# Patient Record
Sex: Female | Born: 1970 | Race: White | Hispanic: No | Marital: Married | Smoking: Former smoker
Health system: Southern US, Community
[De-identification: ages and names within clinical notes are randomized; demographics above are authoritative.]

## PROBLEM LIST (undated history)

## (undated) DIAGNOSIS — Z8679 Personal history of other diseases of the circulatory system: Secondary | ICD-10-CM

## (undated) DIAGNOSIS — F32A Depression, unspecified: Secondary | ICD-10-CM

## (undated) DIAGNOSIS — D6804 Acquired von Willebrand disease: Secondary | ICD-10-CM

## (undated) DIAGNOSIS — I619 Nontraumatic intracerebral hemorrhage, unspecified: Secondary | ICD-10-CM

## (undated) DIAGNOSIS — E785 Hyperlipidemia, unspecified: Secondary | ICD-10-CM

## (undated) DIAGNOSIS — F1090 Alcohol use, unspecified, uncomplicated: Secondary | ICD-10-CM

## (undated) DIAGNOSIS — G4733 Obstructive sleep apnea (adult) (pediatric): Secondary | ICD-10-CM

## (undated) DIAGNOSIS — K219 Gastro-esophageal reflux disease without esophagitis: Secondary | ICD-10-CM

## (undated) DIAGNOSIS — G5600 Carpal tunnel syndrome, unspecified upper limb: Secondary | ICD-10-CM

## (undated) DIAGNOSIS — R079 Chest pain, unspecified: Secondary | ICD-10-CM

## (undated) DIAGNOSIS — G609 Hereditary and idiopathic neuropathy, unspecified: Secondary | ICD-10-CM

## (undated) HISTORY — DX: Alcohol use, unspecified, uncomplicated: F10.90

## (undated) HISTORY — DX: Acquired von Willebrand disease: D68.04

## (undated) HISTORY — DX: Personal history of other diseases of the circulatory system: Z86.79

## (undated) HISTORY — DX: Hereditary and idiopathic neuropathy, unspecified: G60.9

## (undated) HISTORY — DX: Carpal tunnel syndrome, unspecified upper limb: G56.00

## (undated) HISTORY — DX: Hyperlipidemia, unspecified: E78.5

## (undated) HISTORY — DX: Depression, unspecified: F32.A

## (undated) HISTORY — DX: Chest pain, unspecified: R07.9

## (undated) HISTORY — PX: BRAIN SURGERY: SHX531

## (undated) HISTORY — DX: Obstructive sleep apnea (adult) (pediatric): G47.33

---

## 1998-01-13 ENCOUNTER — Other Ambulatory Visit: Admission: RE | Admit: 1998-01-13 | Discharge: 1998-01-13 | Payer: Self-pay | Admitting: Obstetrics and Gynecology

## 1999-03-04 ENCOUNTER — Other Ambulatory Visit: Admission: RE | Admit: 1999-03-04 | Discharge: 1999-03-04 | Payer: Self-pay | Admitting: Obstetrics and Gynecology

## 2000-03-15 ENCOUNTER — Other Ambulatory Visit: Admission: RE | Admit: 2000-03-15 | Discharge: 2000-03-15 | Payer: Self-pay | Admitting: Obstetrics and Gynecology

## 2000-06-06 ENCOUNTER — Encounter: Payer: Self-pay | Admitting: Internal Medicine

## 2000-06-06 ENCOUNTER — Encounter: Admission: RE | Admit: 2000-06-06 | Discharge: 2000-06-06 | Payer: Self-pay | Admitting: Internal Medicine

## 2001-03-27 ENCOUNTER — Other Ambulatory Visit: Admission: RE | Admit: 2001-03-27 | Discharge: 2001-03-27 | Payer: Self-pay | Admitting: Obstetrics and Gynecology

## 2012-05-15 DIAGNOSIS — M545 Low back pain, unspecified: Secondary | ICD-10-CM | POA: Insufficient documentation

## 2012-05-21 DIAGNOSIS — M533 Sacrococcygeal disorders, not elsewhere classified: Secondary | ICD-10-CM | POA: Insufficient documentation

## 2012-12-13 ENCOUNTER — Other Ambulatory Visit: Payer: Self-pay | Admitting: Neurosurgery

## 2012-12-13 DIAGNOSIS — M549 Dorsalgia, unspecified: Secondary | ICD-10-CM

## 2012-12-20 ENCOUNTER — Ambulatory Visit
Admission: RE | Admit: 2012-12-20 | Discharge: 2012-12-20 | Disposition: A | Discharge status: Home / Self Care | Payer: BC Managed Care – PPO | Source: Ambulatory Visit | Attending: Neurosurgery | Admitting: Neurosurgery

## 2012-12-20 DIAGNOSIS — M549 Dorsalgia, unspecified: Secondary | ICD-10-CM

## 2014-12-01 DIAGNOSIS — R200 Anesthesia of skin: Secondary | ICD-10-CM | POA: Insufficient documentation

## 2014-12-01 DIAGNOSIS — M5416 Radiculopathy, lumbar region: Secondary | ICD-10-CM | POA: Insufficient documentation

## 2014-12-01 DIAGNOSIS — M51369 Other intervertebral disc degeneration, lumbar region without mention of lumbar back pain or lower extremity pain: Secondary | ICD-10-CM | POA: Insufficient documentation

## 2014-12-15 DIAGNOSIS — M4726 Other spondylosis with radiculopathy, lumbar region: Secondary | ICD-10-CM | POA: Insufficient documentation

## 2015-09-23 ENCOUNTER — Emergency Department: Payer: BLUE CROSS/BLUE SHIELD

## 2015-09-23 ENCOUNTER — Emergency Department
Admission: EM | Admit: 2015-09-23 | Discharge: 2015-09-23 | Disposition: A | Discharge status: Home / Self Care | Payer: BLUE CROSS/BLUE SHIELD | Attending: Emergency Medicine | Admitting: Emergency Medicine

## 2015-09-23 ENCOUNTER — Encounter: Payer: Self-pay | Admitting: Emergency Medicine

## 2015-09-23 DIAGNOSIS — S91011A Laceration without foreign body, right ankle, initial encounter: Secondary | ICD-10-CM | POA: Insufficient documentation

## 2015-09-23 DIAGNOSIS — Y9389 Activity, other specified: Secondary | ICD-10-CM | POA: Insufficient documentation

## 2015-09-23 DIAGNOSIS — W1841XA Slipping, tripping and stumbling without falling due to stepping on object, initial encounter: Secondary | ICD-10-CM | POA: Diagnosis not present

## 2015-09-23 DIAGNOSIS — Y998 Other external cause status: Secondary | ICD-10-CM | POA: Insufficient documentation

## 2015-09-23 DIAGNOSIS — Y9289 Other specified places as the place of occurrence of the external cause: Secondary | ICD-10-CM | POA: Diagnosis not present

## 2015-09-23 DIAGNOSIS — Z23 Encounter for immunization: Secondary | ICD-10-CM | POA: Insufficient documentation

## 2015-09-23 HISTORY — DX: Nontraumatic intracerebral hemorrhage, unspecified: I61.9

## 2015-09-23 HISTORY — DX: Gastro-esophageal reflux disease without esophagitis: K21.9

## 2015-09-23 MED ORDER — LIDOCAINE HCL (PF) 1 % IJ SOLN
INTRAMUSCULAR | Status: AC
  Filled 2015-09-23: qty 5

## 2015-09-23 MED ORDER — BACITRACIN ZINC 500 UNIT/GM EX OINT
TOPICAL_OINTMENT | Freq: Two times a day (BID) | CUTANEOUS | Status: DC
  Administered 2015-09-23: 07:00:00 via TOPICAL

## 2015-09-23 MED ORDER — LIDOCAINE-EPINEPHRINE-TETRACAINE (LET) SOLUTION
3.0000 mL | Freq: Once | NASAL | Status: AC
  Administered 2015-09-23: 3 mL via TOPICAL
  Filled 2015-09-23: qty 3

## 2015-09-23 MED ORDER — TETANUS-DIPHTH-ACELL PERTUSSIS 5-2.5-18.5 LF-MCG/0.5 IM SUSP
0.5000 mL | Freq: Once | INTRAMUSCULAR | Status: AC
  Administered 2015-09-23: 0.5 mL via INTRAMUSCULAR
  Filled 2015-09-23: qty 0.5

## 2015-09-23 MED ORDER — BACITRACIN ZINC 500 UNIT/GM EX OINT: TOPICAL_OINTMENT | CUTANEOUS | Status: AC

## 2015-09-23 MED ORDER — BACITRACIN ZINC 500 UNIT/GM EX OINT
TOPICAL_OINTMENT | CUTANEOUS | Status: AC
  Filled 2015-09-23: qty 0.9

## 2015-09-23 NOTE — ED Notes (Addendum)
Pt states she stepped on a window. Laceration to R heel. Pt is unsure of tetanus status, but states she had brain surgery a year ago, so she believes all her shots are up to date.
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
## 2015-09-23 NOTE — ED Notes (Signed)
Pt stepped on window has lac to posterior right foot.

## 2015-09-23 NOTE — Discharge Instructions (Signed)
Laceration Care, Adult °A laceration is a cut that goes through all of the layers of the skin and into the tissue that is right under the skin. Some lacerations heal on their own. Others need to be closed with stitches (sutures), staples, skin adhesive strips, or skin glue. Proper laceration care minimizes the risk of infection and helps the laceration to heal better. °HOW TO CARE FOR YOUR LACERATION °If sutures or staples were used: °· Keep the wound clean and dry. °· If you were given a bandage (dressing), you should change it at least one time per day or as told by your health care provider. You should also change it if it becomes wet or dirty. °· Keep the wound completely dry for the first 24 hours or as told by your health care provider. After that time, you may shower or bathe. However, make sure that the wound is not soaked in water until after the sutures or staples have been removed. °· Clean the wound one time each day or as told by your health care provider: °¨ Wash the wound with soap and water. °¨ Rinse the wound with water to remove all soap. °¨ Pat the wound dry with a clean towel. Do not rub the wound. °· After cleaning the wound, apply a thin layer of antibiotic ointment as told by your health care provider. This will help to prevent infection and keep the dressing from sticking to the wound. °· Have the sutures or staples removed as told by your health care provider. °If skin adhesive strips were used: °· Keep the wound clean and dry. °· If you were given a bandage (dressing), you should change it at least one time per day or as told by your health care provider. You should also change it if it becomes dirty or wet. °· Do not get the skin adhesive strips wet. You may shower or bathe, but be careful to keep the wound dry. °· If the wound gets wet, pat it dry with a clean towel. Do not rub the wound. °· Skin adhesive strips fall off on their own. You may trim the strips as the wound heals. Do not  remove skin adhesive strips that are still stuck to the wound. They will fall off in time. °If skin glue was used: °· Try to keep the wound dry, but you may briefly wet it in the shower or bath. Do not soak the wound in water, such as by swimming. °· After you have showered or bathed, gently pat the wound dry with a clean towel. Do not rub the wound. °· Do not do any activities that will make you sweat heavily until the skin glue has fallen off on its own. °· Do not apply liquid, cream, or ointment medicine to the wound while the skin glue is in place. Using those may loosen the film before the wound has healed. °· If you were given a bandage (dressing), you should change it at least one time per day or as told by your health care provider. You should also change it if it becomes dirty or wet. °· If a dressing is placed over the wound, be careful not to apply tape directly over the skin glue. Doing that may cause the glue to be pulled off before the wound has healed. °· Do not pick at the glue. The skin glue usually remains in place for 5-10 days, then it falls off of the skin. °General Instructions °· Take over-the-counter and prescription   medicines only as told by your health care provider. °· If you were prescribed an antibiotic medicine or ointment, take or apply it as told by your doctor. Do not stop using it even if your condition improves. °· To help prevent scarring, make sure to cover your wound with sunscreen whenever you are outside after stitches are removed, after adhesive strips are removed, or when glue remains in place and the wound is healed. Make sure to wear a sunscreen of at least 30 SPF. °· Do not scratch or pick at the wound. °· Keep all follow-up visits as told by your health care provider. This is important. °· Check your wound every day for signs of infection. Watch for: °· Redness, swelling, or pain. °· Fluid, blood, or pus. °· Raise (elevate) the injured area above the level of your heart  while you are sitting or lying down, if possible. °SEEK MEDICAL CARE IF: °· You received a tetanus shot and you have swelling, severe pain, redness, or bleeding at the injection site. °· You have a fever. °· A wound that was closed breaks open. °· You notice a bad smell coming from your wound or your dressing. °· You notice something coming out of the wound, such as wood or glass. °· Your pain is not controlled with medicine. °· You have increased redness, swelling, or pain at the site of your wound. °· You have fluid, blood, or pus coming from your wound. °· You notice a change in the color of your skin near your wound. °· You need to change the dressing frequently due to fluid, blood, or pus draining from the wound. °· You develop a new rash. °· You develop numbness around the wound. °SEEK IMMEDIATE MEDICAL CARE IF: °· You develop severe swelling around the wound. °· Your pain suddenly increases and is severe. °· You develop painful lumps near the wound or on skin that is anywhere on your body. °· You have a red streak going away from your wound. °· The wound is on your hand or foot and you cannot properly move a finger or toe. °· The wound is on your hand or foot and you notice that your fingers or toes look pale or bluish. °  °This information is not intended to replace advice given to you by your health care provider. Make sure you discuss any questions you have with your health care provider. °  °Document Released: 08/08/2005 Document Revised: 12/23/2014 Document Reviewed: 08/04/2014 °Elsevier Interactive Patient Education ©2016 Else[employer] Inc[employer] ° °Stitches, Staples, or Adhesive Wound Closure °Health care providers use stitches (sutures), staples, and certain glue (skin adhesives) to hold skin together while it heals (wound closure). You may need this treatment after you have surgery or if you cut your skin accidentally. These methods help your skin to heal more quickly and make it less likely that you will have  a scar. A wound may take several months to heal completely. °The type of wound you have determines when your wound gets closed. In most cases, the wound is closed as soon as possible (primary skin closure). Sometimes, closure is delayed so the wound can be cleaned and allowed to heal naturally. This reduces the chance of infection. Delayed closure may be needed if your wound: °· Is caused by a bite. °· Happened more than 6 hours ago. °· Involves loss of skin or the tissues under the skin. °· Has dirt or debris in it that cannot be removed. °· Is infected. °WHAT   ARE THE DIFFERENT KINDS OF WOUND CLOSURES? °There are many options for wound closure. The one that your health care provider uses depends on how deep and how large your wound is. °Adhesive Glue °To use this type of glue to close a wound, your health care provider holds the edges of the wound together and paints the glue on the surface of your skin. You may need more than one layer of glue. Then the wound may be covered with a light bandage (dressing). °This type of skin closure may be used for small wounds that are not deep (superficial). Using glue for wound closure is less painful than other methods. It does not require a medicine that numbs the area (local anesthetic). This method also leaves nothing to be removed. Adhesive glue is often used for children and on facial wounds. °Adhesive glue cannot be used for wounds that are deep, uneven, or bleeding. It is not used inside of a wound.  °Adhesive Strips °These strips are made of sticky (adhesive), porous paper. They are applied across your skin edges like a regular adhesive bandage. You leave them on until they fall off. °Adhesive strips may be used to close very superficial wounds. They may also be used along with sutures to improve the closure of your skin edges.  °Sutures °Sutures are the oldest method of wound closure. Sutures can be made from natural substances, such as silk, or from synthetic  materials, such as nylon and steel. They can be made from a material that your body can break down as your wound heals (absorbable), or they can be made from a material that needs to be removed from your skin (nonabsorbable). They come in many different strengths and sizes. °Your health care provider attaches the sutures to a steel needle on one end. Sutures can be passed through your skin, or through the tissues beneath your skin. Then they are tied and cut. Your skin edges may be closed in one continuous stitch or in separate stitches. °Sutures are strong and can be used for all kinds of wounds. Absorbable sutures may be used to close tissues under the skin. The disadvantage of sutures is that they may cause skin reactions that lead to infection. Nonabsorbable sutures need to be removed. °Staples °When surgical staples are used to close a wound, the edges of your skin on both sides of the wound are brought close together. A staple is placed across the wound, and an instrument secures the edges together. Staples are often used to close surgical cuts (incisions). °Staples are faster to use than sutures, and they cause less skin reaction. Staples need to be removed using a tool that bends the staples away from your skin. °HOW DO I CARE FOR MY WOUND CLOSURE? °· Take medicines only as directed by your health care provider. °· If you were prescribed an antibiotic medicine for your wound, finish it all even if you start to feel better. °· Use ointments or creams only as directed by your health care provider. °· Wash your hands with soap and water before and after touching your wound. °· Do not soak your wound in water. Do not take baths, swim, or use a hot tub until your health care provider approves. °· Ask your health care provider when you can start showering. Cover your wound if directed by your health care provider. °· Do not take out your own sutures or staples. °· Do not pick at your wound. Picking can cause an  infection. °·   Keep all follow-up visits as directed by your health care provider. This is important. HOW LONG WILL I HAVE MY WOUND CLOSURE?  Leave adhesive glue on your skin until the glue peels away.  Leave adhesive strips on your skin until the strips fall off.  Absorbable sutures will dissolve within several days.  Nonabsorbable sutures and staples must be removed. The location of the wound will determine how long they stay in. This can range from several days to a couple of weeks. WHEN SHOULD I SEEK HELP FOR MY WOUND CLOSURE? Contact your health care provider if:  You have a fever.  You have chills.  You have drainage, redness, swelling, or pain at your wound.  There is a bad smell coming from your wound.  The skin edges of your wound start to separate after your sutures have been removed.  Your wound becomes thick, raised, and darker in color after your sutures come out (scarring).   This information is not intended to replace advice given to you by your health care provider. Make sure you discuss any questions you have with your health care provider.   Document Released: 05/03/2001 Document Revised: 08/29/2014 Document Reviewed: 01/15/2014 Elsevier Interactive Patient Education 2016 [employer] Inc.  Sutured Wound Care Sutures are stitches that can be used to close wounds. Taking care of your wound properly can help to prevent pain and infection. It can also help your wound to heal more quickly. HOW TO CARE FOR YOUR SUTURED WOUND Wound Care  Keep the wound clean and dry.  If you were given a bandage (dressing), you should change it at least once per day or as directed by your health care provider. You should also change it if it becomes wet or dirty.  Keep the wound completely dry for the first 24 hours or as directed by your health care provider. After that time, you may shower or bathe. However, make sure that the wound is not soaked in water until the sutures have been  removed.  Clean the wound one time each day or as directed by your health care provider.  Wash the wound with soap and water.  Rinse the wound with water to remove all soap.  Pat the wound dry with a clean towel. Do not rub the wound.  Aftercleaning the wound, apply a thin layer of antibioticointment as directed by your health care provider. This will help to prevent infection and keep the dressing from sticking to the wound.  Have the sutures removed as directed by your health care provider. General Instructions  Take or apply medicines only as directed by your health care provider.  To help prevent scarring, make sure to cover your wound with sunscreen whenever you are outside after the sutures are removed and the wound is healed. Make sure to wear a sunscreen of at least 30 SPF.  If you were prescribed an antibiotic medicine or ointment, finish all of it even if you start to feel better.  Do not scratch or pick at the wound.  Keep all follow-up visits as directed by your health care provider. This is important.  Check your wound every day for signs of infection. Watch for:   Redness, swelling, or pain.  Fluid, blood, or pus.  Raise (elevate) the injured area above the level of your heart while you are sitting or lying down, if possible.  Avoid stretching your wound.  Drink enough fluids to keep your urine clear or pale yellow. SEEK MEDICAL CARE IF:  You received a tetanus shot and you have swelling, severe pain, redness, or bleeding at the injection site.  You have a fever.  A wound that was closed breaks open.  You notice a bad smell coming from the wound.  You notice something coming out of the wound, such as wood or glass.  Your pain is not controlled with medicine.  You have increased redness, swelling, or pain at the site of your wound.  You have fluid, blood, or pus coming from your wound.  You notice a change in the color of your skin near your  wound.  You need to change the dressing frequently due to fluid, blood, or pus draining from the wound.  You develop a new rash.  You develop numbness around the wound. SEEK IMMEDIATE MEDICAL CARE IF:  You develop severe swelling around the injury site.  Your pain suddenly increases and is severe.  You develop painful lumps near the wound or on skin that is anywhere on your body.  You have a red streak going away from your wound.  The wound is on your hand or foot and you cannot properly move a finger or toe.  The wound is on your hand or foot and you notice that your fingers or toes look pale or bluish.   This information is not intended to replace advice given to you by your health care provider. Make sure you discuss any questions you have with your health care provider.   Document Released: 09/15/2004 Document Revised: 12/23/2014 Document Reviewed: 03/20/2013 Elsevier Interactive Patient Education [employer]

## 2015-11-05 DIAGNOSIS — K219 Gastro-esophageal reflux disease without esophagitis: Secondary | ICD-10-CM | POA: Insufficient documentation

## 2015-11-05 DIAGNOSIS — E7800 Pure hypercholesterolemia, unspecified: Secondary | ICD-10-CM | POA: Insufficient documentation

## 2015-11-05 DIAGNOSIS — M879 Osteonecrosis, unspecified: Secondary | ICD-10-CM | POA: Insufficient documentation

## 2015-11-05 DIAGNOSIS — K589 Irritable bowel syndrome without diarrhea: Secondary | ICD-10-CM | POA: Insufficient documentation

## 2015-11-05 DIAGNOSIS — R791 Abnormal coagulation profile: Secondary | ICD-10-CM | POA: Insufficient documentation

## 2015-11-12 DIAGNOSIS — J301 Allergic rhinitis due to pollen: Secondary | ICD-10-CM | POA: Insufficient documentation

## 2016-04-19 DIAGNOSIS — F331 Major depressive disorder, recurrent, moderate: Secondary | ICD-10-CM | POA: Insufficient documentation

## 2016-05-19 ENCOUNTER — Ambulatory Visit (HOSPITAL_COMMUNITY): Payer: BLUE CROSS/BLUE SHIELD | Admitting: Licensed Clinical Social Worker

## 2016-09-06 DIAGNOSIS — M1712 Unilateral primary osteoarthritis, left knee: Secondary | ICD-10-CM | POA: Insufficient documentation

## 2017-05-18 DIAGNOSIS — G5711 Meralgia paresthetica, right lower limb: Secondary | ICD-10-CM | POA: Insufficient documentation

## 2017-07-06 ENCOUNTER — Encounter: Payer: Self-pay | Admitting: Registered"

## 2017-07-06 ENCOUNTER — Encounter: Payer: BLUE CROSS/BLUE SHIELD | Attending: *Deleted | Admitting: Registered"

## 2017-07-06 DIAGNOSIS — Z713 Dietary counseling and surveillance: Secondary | ICD-10-CM | POA: Insufficient documentation

## 2017-07-06 DIAGNOSIS — E7800 Pure hypercholesterolemia, unspecified: Secondary | ICD-10-CM | POA: Insufficient documentation

## 2017-07-06 NOTE — Patient Instructions (Signed)
Think about a backup plan for exercise: might help to make goals that are SMART: Specific, Measurable, Attainable, Relevant and Timely. Consider having more protein with your breakfast Consider having fish 2-3 week, look for those higher in omega 3

## 2017-07-06 NOTE — Progress Notes (Addendum)
Medical Nutrition Therapy:  Appt start time: 1405 end time:  1510.  Assessment:  Primary concerns today: Pt states she was 145 lbs before 2015 brain surgery and since has gained weight. Pt states she also has had issues with period and not sure where she is on the menopausal timeframe as it had previously stopped for a year.  Pt states she has had lab work that has indicated high cholesterol. RD did not have time to address cholesterol topic today, but will revisit at next appointment. Per referral labs ttl chol: 315, HDL 54. A1c 5.0%, TSH 4.0. Vit D 26.8 ng/mL. BP 122/80  Pt states she and her husband enjoys cooking and looking forward to holiday baking. Pt states [place], 1-2x times month, and enjoy eating out and having a few beers.   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 Pt states with rainy weather she doesn't get much activity. Pt states she takes gabapentin for nerve pain in leg and also has back pain that limits some of her activity. Pt also states her activity has slowed down since her husband's ability to move around has been decreased after trouble with his hip since they spend a lot of time together.  Sleep: good.  Preferred Learning Style:   No preference indicated   Learning Readiness:   Ready  MEDICATIONS: reviewed    DIETARY INTAKE:  Avoided foods include: walnuts are the only nut she doesn't like.    24-hr recall:  B ( AM): 1-2 c coffee, shredded wheat, almond or 2% milk OR apple jacks OR mcdonalds Snk ( AM): 90 cal cereal bars for snack or lunch OR yogurt  L ( PM): [employer] tuna sandwich OR salads from [employer] (apple pecan) Snk ( PM):  D ( PM): stuffed pablano peppers OR steak, veggies,  Snk ( PM): jello Beverages: 2 alcoholic drinks, sometimes hard lemonade, diet ginger ale, water  Usual physical activity: yard work, taking care of animals  Estimated energy needs: 1800  calories 200 g carbohydrates 113 g protein 60  g fat  Progress Towards Goal(s):  In progress.   Nutritional Diagnosis:  NI-5.8.4 Inconsistent carbohydrate intake As related to carb only breakfast.  As evidenced by dietary recall.    Intervention:  Nutrition Education. Discussed macronutrients and importance of balanced meals. Discussed exercise role in health. Provided information about fatty fish and the basics of Mediterranean and DASH diet. Discussed the difference between portion control and intuitive eating (basics).  Teaching Method Utilized:  Visual Auditory  Handouts given during visit include:  My Plate Planner  Snack Sheet  Fish guide  Barriers to learning/adherence to lifestyle change: none  Demonstrated degree of understanding via:  Teach Back   Monitoring/Evaluation:  Dietary intake, exercise, and body weight in 6 week(s).

## 2017-09-05 ENCOUNTER — Ambulatory Visit: Payer: BLUE CROSS/BLUE SHIELD | Admitting: Registered"

## 2018-06-14 ENCOUNTER — Emergency Department (HOSPITAL_COMMUNITY): Payer: PRIVATE HEALTH INSURANCE

## 2018-06-14 ENCOUNTER — Emergency Department (HOSPITAL_COMMUNITY)
Admission: EM | Admit: 2018-06-14 | Discharge: 2018-06-14 | Disposition: A | Discharge status: Home / Self Care | Payer: PRIVATE HEALTH INSURANCE | Attending: Emergency Medicine | Admitting: Emergency Medicine

## 2018-06-14 ENCOUNTER — Other Ambulatory Visit: Payer: Self-pay

## 2018-06-14 ENCOUNTER — Encounter (HOSPITAL_COMMUNITY): Payer: Self-pay | Admitting: Emergency Medicine

## 2018-06-14 DIAGNOSIS — J0120 Acute ethmoidal sinusitis, unspecified: Secondary | ICD-10-CM | POA: Diagnosis not present

## 2018-06-14 DIAGNOSIS — R519 Headache, unspecified: Secondary | ICD-10-CM

## 2018-06-14 DIAGNOSIS — R51 Headache: Secondary | ICD-10-CM

## 2018-06-14 DIAGNOSIS — Z79899 Other long term (current) drug therapy: Secondary | ICD-10-CM | POA: Diagnosis not present

## 2018-06-14 DIAGNOSIS — Z8679 Personal history of other diseases of the circulatory system: Secondary | ICD-10-CM | POA: Diagnosis not present

## 2018-06-14 LAB — I-STAT TROPONIN, ED: Troponin i, poc: 0 ng/mL (ref 0.00–0.08)

## 2018-06-14 LAB — DIFFERENTIAL
ABS IMMATURE GRANULOCYTES: 0.01 10*3/uL (ref 0.00–0.07)
Basophils Absolute: 0.1 10*3/uL (ref 0.0–0.1)
Basophils Relative: 1 %
EOS ABS: 0.1 10*3/uL (ref 0.0–0.5)
Eosinophils Relative: 2 %
IMMATURE GRANULOCYTES: 0 %
LYMPHS ABS: 1 10*3/uL (ref 0.7–4.0)
LYMPHS PCT: 20 %
Monocytes Absolute: 0.4 10*3/uL (ref 0.1–1.0)
Monocytes Relative: 8 %
Neutro Abs: 3.6 10*3/uL (ref 1.7–7.7)
Neutrophils Relative %: 69 %

## 2018-06-14 LAB — CBC
HEMATOCRIT: 42.4 % (ref 36.0–46.0)
Hemoglobin: 13.4 g/dL (ref 12.0–15.0)
MCH: 29.5 pg (ref 26.0–34.0)
MCHC: 31.6 g/dL (ref 30.0–36.0)
MCV: 93.4 fL (ref 80.0–100.0)
NRBC: 0 % (ref 0.0–0.2)
Platelets: 326 10*3/uL (ref 150–400)
RBC: 4.54 MIL/uL (ref 3.87–5.11)
RDW: 13.2 % (ref 11.5–15.5)
WBC: 5.3 10*3/uL (ref 4.0–10.5)

## 2018-06-14 LAB — COMPREHENSIVE METABOLIC PANEL
ALT: 15 U/L (ref 0–44)
AST: 21 U/L (ref 15–41)
Albumin: 4.4 g/dL (ref 3.5–5.0)
Alkaline Phosphatase: 65 U/L (ref 38–126)
Anion gap: 7 (ref 5–15)
BILIRUBIN TOTAL: 1 mg/dL (ref 0.3–1.2)
BUN: 7 mg/dL (ref 6–20)
CO2: 25 mmol/L (ref 22–32)
Calcium: 9.5 mg/dL (ref 8.9–10.3)
Chloride: 107 mmol/L (ref 98–111)
Creatinine, Ser: 0.69 mg/dL (ref 0.44–1.00)
GFR calc Af Amer: >60 mL/min (ref >60)
GFR calc non Af Amer: >60 mL/min (ref >60)
GLUCOSE: 93 mg/dL (ref 70–99)
POTASSIUM: 3.8 mmol/L (ref 3.5–5.1)
Sodium: 139 mmol/L (ref 135–145)
TOTAL PROTEIN: 7.2 g/dL (ref 6.5–8.1)

## 2018-06-14 LAB — I-STAT CHEM 8, ED
BUN: 7 mg/dL (ref 6–20)
CREATININE: 0.6 mg/dL (ref 0.44–1.00)
Calcium, Ion: 1.15 mmol/L (ref 1.15–1.40)
Chloride: 106 mmol/L (ref 98–111)
Glucose, Bld: 90 mg/dL (ref 70–99)
HEMATOCRIT: 42 % (ref 36.0–46.0)
Hemoglobin: 14.3 g/dL (ref 12.0–15.0)
POTASSIUM: 3.8 mmol/L (ref 3.5–5.1)
Sodium: 138 mmol/L (ref 135–145)
TCO2: 24 mmol/L (ref 22–32)

## 2018-06-14 LAB — PROTIME-INR
INR: 1.01
Prothrombin Time: 13.2 seconds (ref 11.4–15.2)

## 2018-06-14 LAB — APTT: aPTT: 33 seconds (ref 24–36)

## 2018-06-14 MED ORDER — KETOROLAC TROMETHAMINE 30 MG/ML IJ SOLN
30.0000 mg | Freq: Once | INTRAMUSCULAR | Status: AC
  Administered 2018-06-14: 30 mg via INTRAVENOUS
  Filled 2018-06-14: qty 1

## 2018-06-14 MED ORDER — AMOXICILLIN-POT CLAVULANATE 875-125 MG PO TABS: 1.0000 | ORAL_TABLET | Freq: Two times a day (BID) | ORAL | 0 refills | Status: DC

## 2018-06-14 MED ORDER — PROCHLORPERAZINE EDISYLATE 10 MG/2ML IJ SOLN
10.0000 mg | Freq: Once | INTRAMUSCULAR | Status: AC
  Administered 2018-06-14: 10 mg via INTRAVENOUS
  Filled 2018-06-14: qty 2

## 2018-06-14 MED ORDER — TRIAMCINOLONE ACETONIDE 55 MCG/ACT NA AERO: 2.0000 | INHALATION_SPRAY | Freq: Every day | NASAL | 12 refills | Status: DC

## 2018-06-14 MED ORDER — IBUPROFEN 800 MG PO TABS: 800.0000 mg | ORAL_TABLET | Freq: Three times a day (TID) | ORAL | 0 refills | Status: AC | PRN

## 2018-06-14 MED ORDER — DIPHENHYDRAMINE HCL 50 MG/ML IJ SOLN
25.0000 mg | Freq: Once | INTRAMUSCULAR | Status: AC
  Administered 2018-06-14: 25 mg via INTRAVENOUS
  Filled 2018-06-14: qty 1

## 2018-06-14 MED ORDER — SODIUM CHLORIDE 0.9 % IV BOLUS
1000.0000 mL | Freq: Once | INTRAVENOUS | Status: AC
  Administered 2018-06-14: 1000 mL via INTRAVENOUS

## 2018-06-14 MED ORDER — DEXAMETHASONE SODIUM PHOSPHATE 10 MG/ML IJ SOLN
10.0000 mg | Freq: Once | INTRAMUSCULAR | Status: AC
  Administered 2018-06-14: 10 mg via INTRAVENOUS
  Filled 2018-06-14: qty 1

## 2018-06-14 NOTE — ED Notes (Signed)
Patient reports feels better and would like to go home now. Assured she will be discharged in a little bit since shifts and nurses changed.

## 2018-06-14 NOTE — Discharge Instructions (Signed)
Continue taking the Mucinex.  Increase her fluid intake.  Rest as much as possible.  Follow-up with your doctor.

## 2018-06-14 NOTE — ED Triage Notes (Signed)
To ed via [place] [facility] from home- with c/o headache started 1 week ago- hx of brain bleed with surgery in 2015 - went to dr today and was sent here for follow up with neuro.

## 2018-06-14 NOTE — ED Provider Notes (Signed)
[facility] EMERGENCY DEPARTMENT Provider Note   CSN: [id number] Arrival date & time: 06/14/18  1138     History   Chief Complaint Chief Complaint  Patient presents with  . Headache    HPI Kristen Vega is a 47 y.o. female.  HPI Patient presents to the emergency department with severe headache that started 1 week ago.  Patient states that she is had a brain bleed in the past.  Patient states that light seems to make it worse.  Patient states that she was sent here by her primary doctor.  States she took over-the-counter medications without any significant relief of her symptoms.  The patient denies chest pain, shortness of breath, blurred vision, neck pain, fever, cough, weakness, numbness, dizziness, anorexia, edema, abdominal pain, nausea, vomiting, diarrhea, rash, back pain, dysuria, hematemesis, bloody stool, near syncope, or syncope. Past Medical History:  Diagnosis Date  . GERD (gastroesophageal reflux disease)   . ICH (intracerebral hemorrhage) (HCC)     There are no active problems to display for this patient.   Past Surgical History:  Procedure Laterality Date  . BRAIN SURGERY       OB History   None      Home Medications    Prior to Admission medications   Medication Sig Start Date End Date Taking? Authorizing Provider  Cetirizine-Pseudoephedrine (ZYRTEC-D PO) Take 1 tablet by mouth at bedtime.    Yes [provider]  esomeprazole (NEXIUM) 40 MG capsule Take 40 mg daily at 12 noon by mouth.   Yes [provider]  estradiol (CLIMARA - DOSED IN MG/24 HR) 0.1 mg/24hr patch Place 0.1 mg once a week onto the skin.   Yes [provider]  FLUoxetine (PROZAC) 20 MG capsule Take 20 mg by mouth at bedtime.    Yes [provider]  gabapentin (NEURONTIN) 600 MG tablet Take 600 mg by mouth 2 (two) times daily.    Yes [provider]  guaiFENesin (MUCINEX) 600 MG 12 hr tablet Take 660 mg by mouth 2  (two) times daily as needed for to loosen phlegm.   Yes [provider]  progesterone (PROMETRIUM) 100 MG capsule Take 100 mg by mouth at bedtime.    Yes [provider]  traMADol (ULTRAM) 50 MG tablet Take 50 mg by mouth every 6 (six) hours as needed for moderate pain or severe pain (Back pain).    Yes [provider]  vitamin E 1000 UNIT capsule Take 1,000 Units by mouth 2 (two) times daily.   Yes [provider]    Family History No family history on file.  Social History Social History   Tobacco Use  . Smoking status: Never Smoker  Substance Use Topics  . Alcohol use: Yes  . Drug use: Not on file     Allergies   Patient has no known allergies.   Review of Systems Review of Systems All other systems negative except as documented in the HPI. All pertinent positives and negatives as reviewed in the HPI.  Physical Exam Updated Vital Signs BP (!) 128/93   Pulse 70   Temp 97.9 F (36.6 C) (Oral)   Resp 12   Ht 5\' 3"  (1.6 m)   Wt 81.2 kg   LMP 06/07/2018 Comment: spotting- on HRT  SpO2 97%   BMI 31.71 kg/m   Physical Exam  Constitutional: She is oriented to person, place, and time. She appears well-developed and well-nourished. No distress.  HENT:  Head:  Normocephalic and atraumatic.  Mouth/Throat: Oropharynx is clear and moist.  Eyes: Pupils are equal, round, and reactive to light.  Neck: Normal range of motion. Neck supple.  Cardiovascular: Normal rate, regular rhythm and normal heart sounds. Exam reveals no gallop and no friction rub.  No murmur heard. Pulmonary/Chest: Effort normal and breath sounds normal. No respiratory distress. She has no wheezes.  Abdominal: Soft. Bowel sounds are normal. She exhibits no distension. There is no tenderness.  Neurological: She is alert and oriented to person, place, and time. She has normal strength. She exhibits normal muscle tone. Coordination and gait normal. GCS eye subscore is 4. GCS  verbal subscore is 5. GCS motor subscore is 6.  Skin: Skin is warm and dry. Capillary refill takes less than 2 seconds. No rash noted. No erythema.  Psychiatric: She has a normal mood and affect. Her behavior is normal.  Nursing note and vitals reviewed.    ED Treatments / Results  Labs (all labs ordered are listed, but only abnormal results are displayed) Labs Reviewed  PROTIME-INR  APTT  CBC  DIFFERENTIAL  COMPREHENSIVE METABOLIC PANEL  I-STAT TROPONIN, ED  CBG MONITORING, ED  I-STAT CHEM 8, ED    EKG None  Radiology Ct Head Wo Contrast  Result Date: 06/14/2018 CLINICAL DATA:  Headache, progressing EXAM: CT HEAD WITHOUT CONTRAST TECHNIQUE: Contiguous axial images were obtained from the base of the skull through the vertex without intravenous contrast. COMPARISON:  None. FINDINGS: Brain: The ventricles are normal in size and configuration. There is no intracranial mass, hemorrhage, extra-axial fluid collection, or midline shift. Brain parenchyma appears unremarkable. No evident acute infarct. Vascular: No hyperdense vessel. There is no evident vascular calcification. Skull: The patient has had a previous left frontal craniotomy. Bony calvarium otherwise appears intact. Sinuses/Orbits: There is mucosal thickening in several ethmoid air cells. Other visualized paranasal sinuses are clear. Orbits appear symmetric bilaterally. Other: Mastoids on the right are clear. There is opacification of the mastoid air cells on the left. IMPRESSION: 1. Previous left-sided craniotomy. Bony structures otherwise unremarkable. 2. Brain parenchyma appears unremarkable. No mass or hemorrhage. No evident acute infarct. 3. There is mucosal thickening in several ethmoid air cells. There is extensive mastoid air cell disease on the left. Electronically Signed   By: Bretta Bang III M.D.   On: 06/14/2018 13:12    Procedures Procedures (including critical care time)  Medications Ordered in  ED Medications  sodium chloride 0.9 % bolus 1,000 mL (0 mLs Intravenous Stopped 06/14/18 1811)  ketorolac (TORADOL) 30 MG/ML injection 30 mg (30 mg Intravenous Given 06/14/18 1640)  diphenhydrAMINE (BENADRYL) injection 25 mg (25 mg Intravenous Given 06/14/18 1640)  prochlorperazine (COMPAZINE) injection 10 mg (10 mg Intravenous Given 06/14/18 1639)  dexamethasone (DECADRON) injection 10 mg (10 mg Intravenous Given 06/14/18 1639)     Initial Impression / Assessment and Plan / ED Course  I have reviewed the triage vital signs and the nursing notes.  Pertinent labs & imaging results that were available during my care of the patient were reviewed by me and considered in my medical decision making (see chart for details).     Patient has had some nasal congestion and drainage over the last few weeks.  Patient does have severe ethmoid sinusitis based on her CT scan findings.  Patient will be advised to follow-up with her primary doctor.  The patient is feeling almost complete resolution of her symptoms since getting the medications and fluids.  She is advised to return  for any worsening in her condition.  That her headache is related to the sinusitis rather than an intracranial process. Final Clinical Impressions(s) / ED Diagnoses   Final diagnoses:  None    ED Discharge Orders    None       Charlestine Night, PA-C 06/15/18 0023    Little, Ambrose Finland, MD 06/17/18 1025

## 2018-06-26 DIAGNOSIS — M5459 Other low back pain: Secondary | ICD-10-CM | POA: Insufficient documentation

## 2019-12-04 IMAGING — CT CT HEAD W/O CM
3 series · 14 of 47 positions shown, 16 images · non-contrast
Comparison: None.

CLINICAL DATA: Headache, progressing

EXAM:
CT HEAD WITHOUT CONTRAST
TECHNIQUE: Contiguous axial images were obtained from the base of the skull
through the vertex without intravenous contrast.

[Series 3: head 5.0 h30s · axial · 0.48mm/px · z∈[-127,+13]mm · 8 of 34 slices shown, 10 images]
[im 3/34  brain]
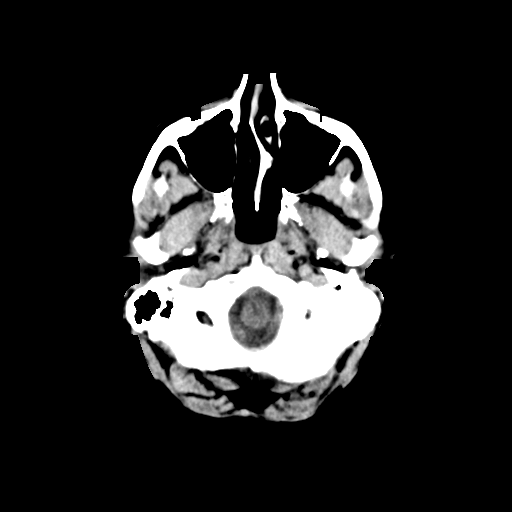
[im 3/34  bone]
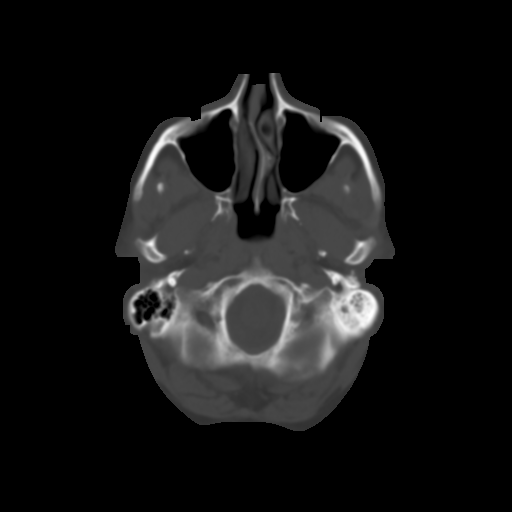
[im 7/34  brain]
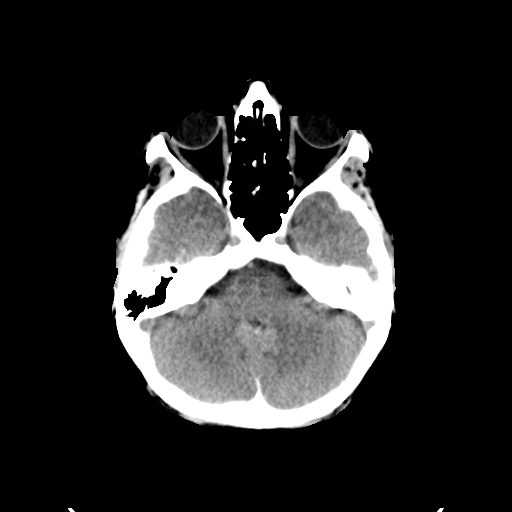
[im 11/34  brain]
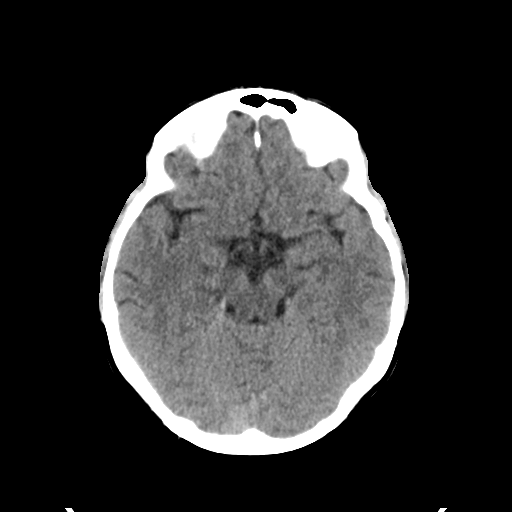
[im 15/34  brain]
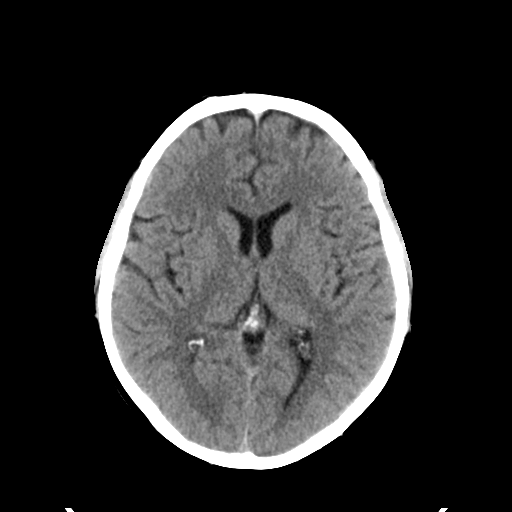
[im 19/34  brain]
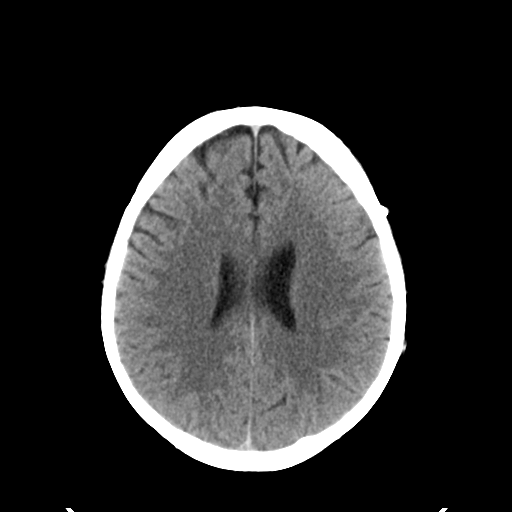
[im 19/34  bone]
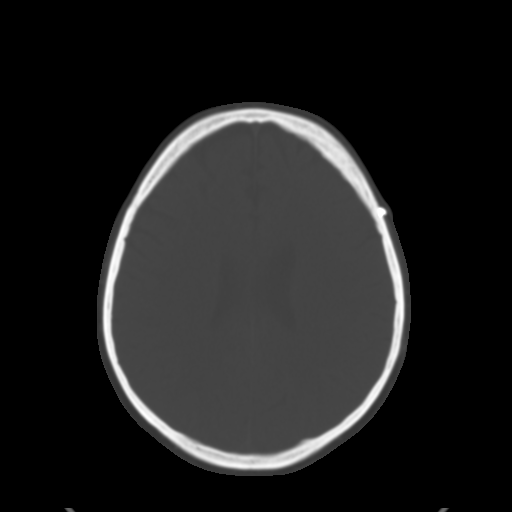
[im 23/34  brain]
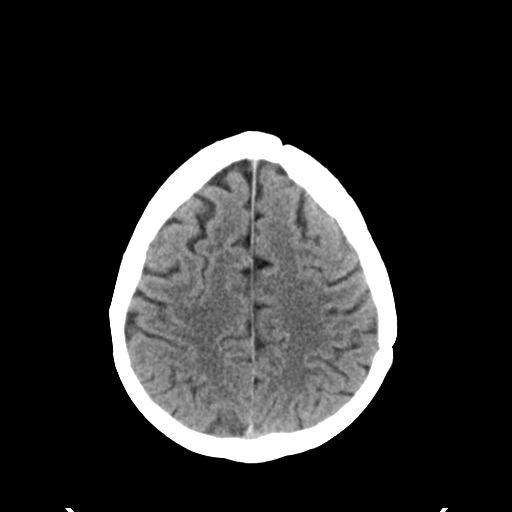
[im 27/34  brain]
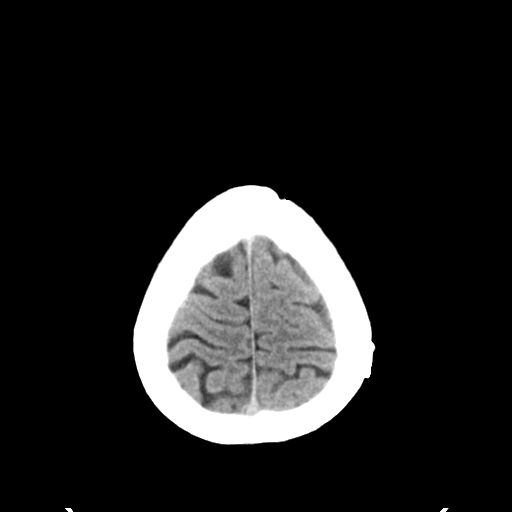
[im 31/34  brain]
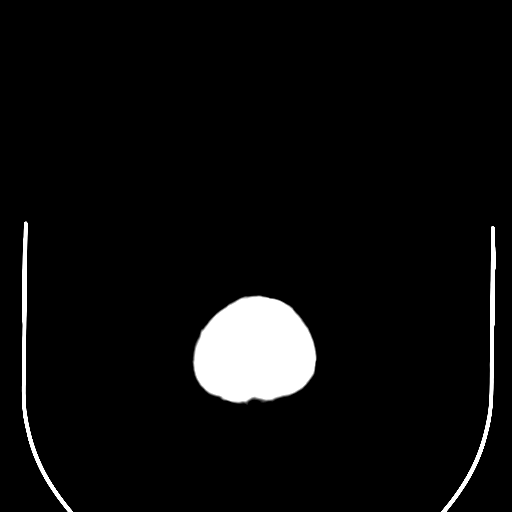

[Series 5: head 3.0 mpr sag · sagittal · 0.32mm/px · 3 of 67 slices shown]
[im 23/67  brain]
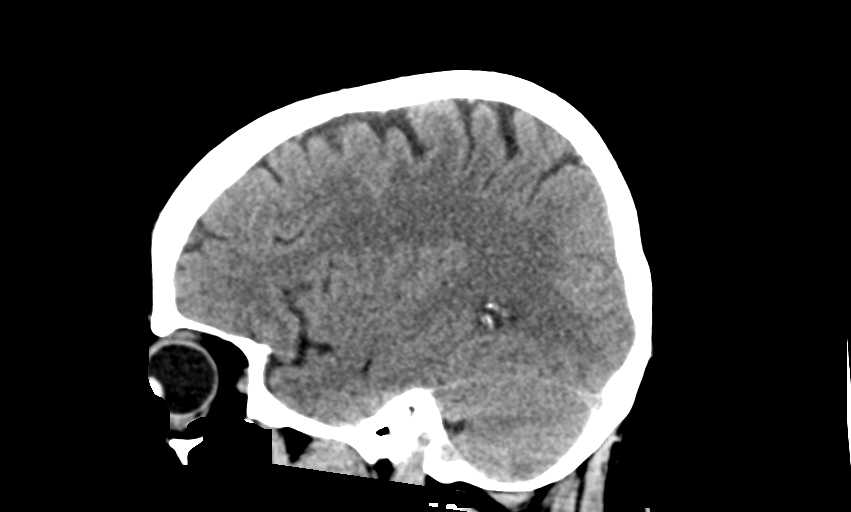
[im 34/67  brain]
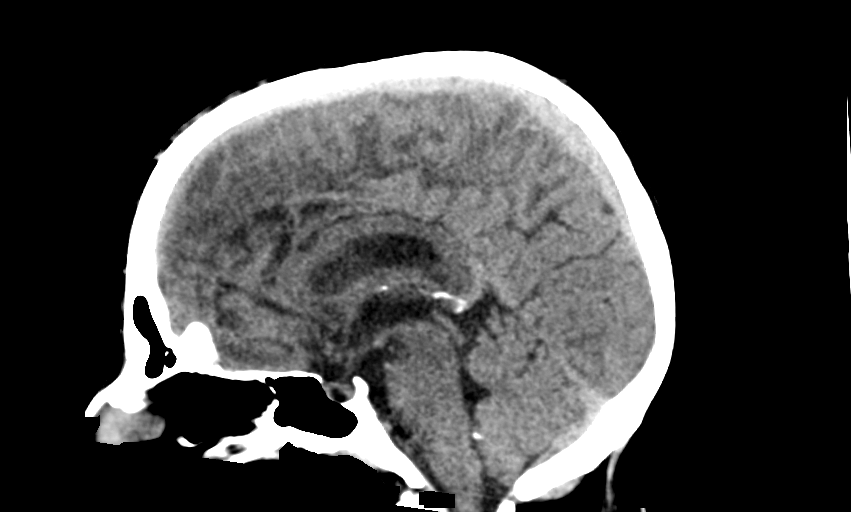
[im 45/67  brain]
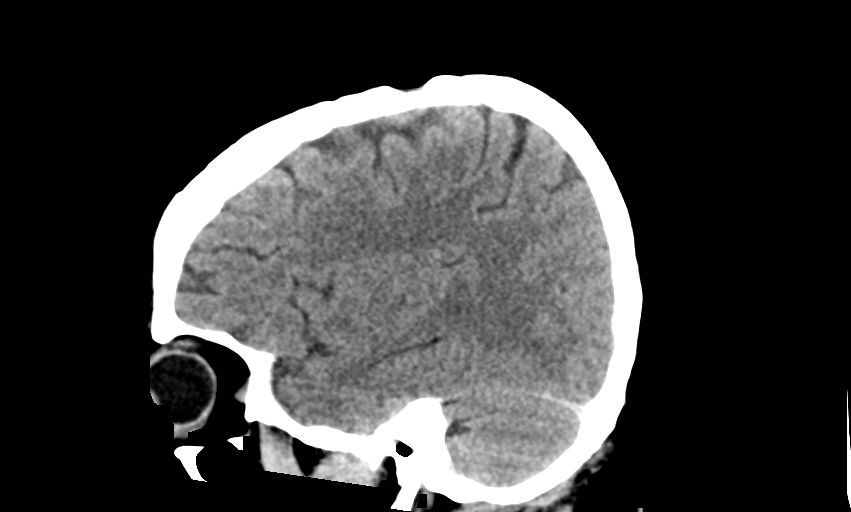

[Series 6: head 3.0 mpr cor · coronal · 0.32mm/px · 3 of 76 slices shown]
[im 26/76  brain]
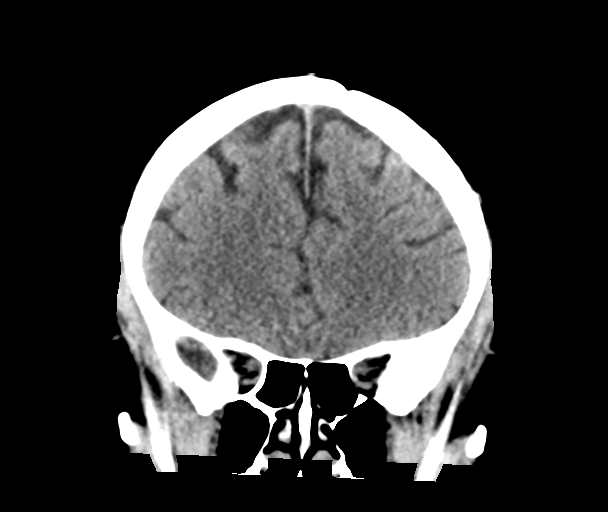
[im 34/76  brain]
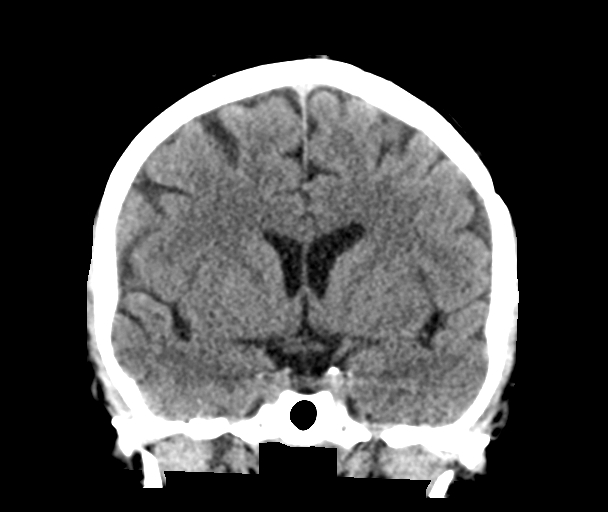
[im 42/76  brain]
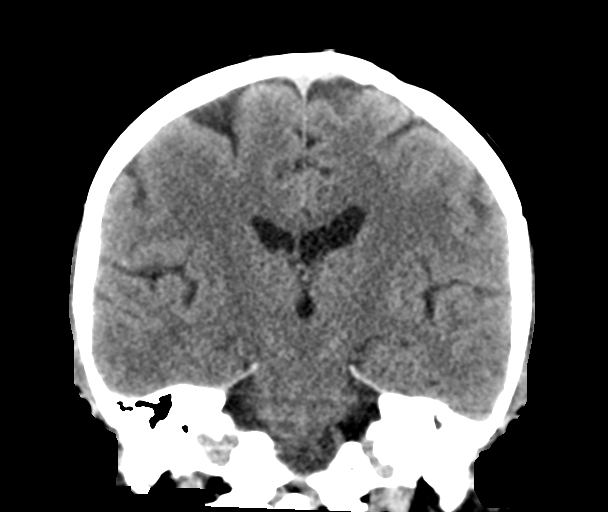

[14 of 47 positions shown; findings below may reference images not displayed]

FINDINGS: Brain: The ventricles are normal in size and configuration. There is
no intracranial mass, hemorrhage, extra-axial fluid collection, or
midline shift. Brain parenchyma appears unremarkable. No evident
acute infarct.

Vascular: No hyperdense vessel. There is no evident vascular
calcification.

Skull: The patient has had a previous left frontal craniotomy. Bony
calvarium otherwise appears intact.

Sinuses/Orbits: There is mucosal thickening in several ethmoid air
cells. Other visualized paranasal sinuses are clear. Orbits appear
symmetric bilaterally.

Other: Mastoids on the right are clear. There is opacification of
the mastoid air cells on the left.
IMPRESSION: 1. Previous left-sided craniotomy. Bony structures otherwise
unremarkable.

2. Brain parenchyma appears unremarkable. No mass or hemorrhage. No
evident acute infarct.

3. There is mucosal thickening in several ethmoid air cells. There
is extensive mastoid air cell disease on the left.

## 2021-11-16 NOTE — Progress Notes (Signed)
?Cardiology Office Note:   ? ?Date:  11/17/2021  ? ?ID:  Kristen Vega, DOB 1971/05/13, MRN [id number] ? ?PCP:  Kristen Hasten, PA-C  ? ?[facility] Providers ?Cardiologist:  Alverda Skeans, MD ?Referring MD: [name], MD  ? ?Chief Complaint/Reason for Referral:  Chest pain ? ?ASSESSMENT:   ? ?1. Chest pain, unspecified type   ?2. Precordial pain   ? ? ?PLAN:   ? ?In order of problems listed above: ? ?The patient does have a anterior septal MI pattern on her EKG.  I will obtain an echocardiogram to evaluate this further.  She does have chest pain that seems to have some atypical features.  We will obtain a coronary CTA to evaluate further.  If the patient has mild obstructive coronary artery disease, they will require a statin (with goal LDL < 70) and aspirin, if they have high-grade disease we will need to consider optimal medical therapy and if symptoms are refractory to medical therapy, then a cardiac catheterization with possible PCI will be pursued to alleviate symptoms.  If they have high risk disease we will proceed directly to cardiac catheterization.  Follow up PRN. ? ? ?     ? ?  ? ?Dispo:  No follow-ups on file.  ? ?  ? ?Medication Adjustments/Labs and Tests Ordered: ?Current medicines are reviewed at length with the patient today.  Concerns regarding medicines are outlined above. ? ?The following changes have been made:  no change  ? ?Labs/tests ordered: ?Orders Placed This Encounter  ?Procedures  ? CT CORONARY MORPH W/CTA COR W/SCORE W/CA W/CM &/OR WO/CM  ? EKG 12-Lead  ? ECHOCARDIOGRAM COMPLETE  ? ? ?Medication Changes: ?Meds ordered this encounter  ?Medications  ? metoprolol tartrate (LOPRESSOR) 100 MG tablet  ?  Sig: Take 1 tablet (100 mg total) by mouth once for 1 dose. Take 90-120 minutes prior to scan.  ?  Dispense:  1 tablet  ?  Refill:  0  ? ? ? ?Current medicines are reviewed at length with the patient today.  The patient does not have concerns regarding  medicines. ? ? ?History of Present Illness:   ? ?FOCUSED PROBLEM LIST:   ?1.  Alcohol abuse ?2.  Cerebral aneurysm status post coiling 2015 ?3.  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  Hyperlipidemia ?5.  Von Willebrand Disease (mild, potentially) ? ? ?The patient is a 51 y.o. female with the indicated medical history here for recommendations regarding chest pain.  The patient was seen by her primary care provider recently.  She had reported intermittent chest pain over the left side with associated left arm pain as well.  It was unclear whether it was exertional or occurring at rest.  An EKG was done which demonstrated sinus rhythm and a possible septal infarction. ? ?The patient tells me that she had pleurisy when she was younger and she has a tenderness to palpation of her left chest.  Additionally at times with exertion and at rest she can get a deeper tingling type chest pain.  This does not occur reliably with exertion.  It has happened more than a few times over the last few weeks.  Does not seem to be associate with shortness of breath but she does get short of breath at times.  She does exercise and is trying to lose weight.  She does not seem to have this pain reliably when she exercises but it can happen.  She denies any presyncope, syncope,  palpitations, paroxysmal nocturnal dyspnea, orthopnea. ? ?Current Medications: ?Current Meds  ?Medication Sig  ? atorvastatin (LIPITOR) 40 MG tablet Take 40 mg by mouth daily.  ? Cetirizine-Pseudoephedrine (ZYRTEC-D PO) Take 1 tablet by mouth at bedtime.   ? esomeprazole (NEXIUM) 40 MG capsule Take 40 mg daily at 12 noon by mouth.  ? estradiol (CLIMARA - DOSED IN MG/24 HR) 0.1 mg/24hr patch Place 0.1 mg once a week onto the skin.  ? FLUoxetine (PROZAC) 20 MG capsule Take 20 mg by mouth at bedtime.   ? gabapentin (NEURONTIN) 600 MG tablet Take 600 mg by mouth 2 (two) times daily.   ? ibuprofen (ADVIL,MOTRIN) 800 MG tablet Take 1 tablet (800 mg total) by mouth  every 8 (eight) hours as needed.  ? metoprolol tartrate (LOPRESSOR) 100 MG tablet Take 1 tablet (100 mg total) by mouth once for 1 dose. Take 90-120 minutes prior to scan.  ? progesterone (PROMETRIUM) 100 MG capsule Take 100 mg by mouth at bedtime.   ? traMADol (ULTRAM) 50 MG tablet Take 50 mg by mouth every 6 (six) hours as needed for moderate pain or severe pain (Back pain).   ?  ? ?Allergies:    ?Patient has no known allergies.  ? ?Social History:   ?Social History  ? ?Tobacco Use  ? Smoking status: Former  ?  Types: Cigarettes  ?Substance Use Topics  ? Alcohol use: Yes  ?  ? ?Family Hx: ?Family History  ?Problem Relation Age of Onset  ? Cancer Father   ? Hepatitis C Brother   ? Hypertension Maternal Grandmother   ? Heart disease Maternal Grandmother   ? Stroke Maternal Grandmother   ? Arthritis Maternal Grandmother   ? Arthritis Maternal Grandfather   ? Stroke Maternal Grandfather   ? Heart disease Maternal Grandfather   ? Hypertension Maternal Grandfather   ?  ? ?Review of Systems:   ?Please see the history of present illness.    ?All other systems reviewed and are negative. ?  ? ? ?EKGs/Labs/Other Test Reviewed:   ? ?EKG:  EKG is ordered today. ?The ekg ordered today demonstrates NSR ? ?Prior CV studies: ?None available ? ?Other studies Reviewed: ?Additional studies/ records that were reviewed today include: External EKG. ?Review of the above records demonstrates: Normal sinus rhythm with anteroseptal MI pattern ? ?Recent Labs: ?No results found for requested labs within last 8760 hours.  ? ?Recent Lipid Panel ?No results found for: CHOL, TRIG, HDL, LDLCALC, LDLDIRECT ? ?Risk Assessment/Calculations:   ? ? ?    ? ?Physical Exam:   ? ?VS:  BP 118/70   Pulse 69   Ht 5\' 3"  (1.6 m)   Wt 176 lb (79.8 kg)   SpO2 98%   BMI 31.18 kg/m?    ?Wt Readings from Last 3 Encounters:  ?11/17/21 176 lb (79.8 kg)  ?06/14/18 179 lb (81.2 kg)  ?09/23/15 174 lb (78.9 kg)  ?  ?GENERAL:  No apparent distress, AOx3 ?HEENT:  No  carotid bruits, +2 carotid impulses, no scleral icterus ?CAR: RRR no murmurs, gallops, rubs, or thrills ?RES:  Clear to auscultation bilaterally ?ABD:  Soft, nontender, nondistended, positive bowel sounds x 4 ?VASC:  +2 radial pulses, +2 carotid pulses, palpable pedal pulses ?NEURO:  CN 2-12 grossly intact; motor and sensory grossly intact ?PSYCH:  No active depression or anxiety ?EXT:  No edema, ecchymosis, or cyanosis ? ?Signed, ?Orbie PyoArun K Taveon Enyeart, MD  ?11/17/2021 10:57 AM    ?[facility] ?[phone]  [address] [place], [place]  [address] ?Phone: [phone]; Fax: [phone]  ? ?Note:  This document was prepared using Dragon voice recognition software and may include unintentional dictation errors. ?

## 2021-11-17 ENCOUNTER — Ambulatory Visit (INDEPENDENT_AMBULATORY_CARE_PROVIDER_SITE_OTHER): Payer: BC Managed Care – PPO | Admitting: Internal Medicine

## 2021-11-17 ENCOUNTER — Encounter: Payer: Self-pay | Admitting: Internal Medicine

## 2021-11-17 ENCOUNTER — Other Ambulatory Visit: Payer: Self-pay

## 2021-11-17 VITALS — BP 118/70 | HR 69 | Ht 63.0 in | Wt 176.0 lb

## 2021-11-17 DIAGNOSIS — R079 Chest pain, unspecified: Secondary | ICD-10-CM

## 2021-11-17 DIAGNOSIS — R072 Precordial pain: Secondary | ICD-10-CM | POA: Diagnosis not present

## 2021-11-17 MED ORDER — METOPROLOL TARTRATE 100 MG PO TABS: 100.0000 mg | ORAL_TABLET | Freq: Once | ORAL | 0 refills | Status: AC

## 2021-11-17 NOTE — Patient Instructions (Addendum)
Medication Instructions:  ?No changes ? ?*If you need a refill on your cardiac medications before your next appointment, please call your pharmacy* ? ? ?Lab Work: ?none ? ? ?Testing/Procedures: ?Your physician has requested that you have an echocardiogram. Echocardiography is a painless test that uses sound waves to create images of your heart. It provides your doctor with information about the size and shape of your heart and how well your heart?s chambers and valves are working. This procedure takes approximately one hour. There are no restrictions for this procedure. ? ?Cardiac CTA - see instructions below ? ? ?Follow-Up: ?As needed ? ? ? ?Your cardiac CT will be scheduled at  ?[facility] ?[address] ?[place], [place] [address] ?[phone] ? ?Please arrive at the [facility] and Children's Entrance (Entrance C2) of [facility] 30 minutes prior to test start time. ?You can use the FREE valet parking offered at entrance C (encouraged to control the heart rate for the test)  ?Proceed to the [facility] Radiology Department (first floor) to check-in and test prep. ? ?All radiology patients and guests should use entrance C2 at [facility], accessed from [facility], even though the hospital's physical address listed is [address]. ? ? ? ? ?Please follow these instructions carefully (unless otherwise directed): ? ? ?On the Night Before the Test: ?Be sure to Drink plenty of water. ?Do not consume any caffeinated/decaffeinated beverages or chocolate 12 hours prior to your test. ?Do not take any antihistamines 12 hours prior to your test. ? ?On the Day of the Test: ?Drink plenty of water until 1 hour prior to the test. ?Do not eat any food 4 hours prior to the test. ?You may take your regular medications prior to the test.  ?Take metoprolol (Lopressor) two hours prior to test. ?FEMALES- please wear underwire-free bra if available, avoid dresses & tight clothing ? ?      ?After the Test: ?Drink plenty of water. ?After receiving IV contrast, you may experience a mild flushed feeling. This is normal. ?On occasion, you may experience a mild rash up to 24 hours after the test. This is not dangerous. If this occurs, you can take Benadryl 25 mg and increase your fluid intake. ?If you experience trouble breathing, this can be serious. If it is severe call 911 IMMEDIATELY. If it is mild, please call our office. ?If you take any of these medications: Glipizide/Metformin, Avandament, Glucavance, please do not take 48 hours after completing test unless otherwise instructed. ? ?We will call to schedule your test 2-4 weeks out understanding that some insurance companies will need an authorization prior to the service being performed.  ? ?For non-scheduling related questions, please contact the cardiac imaging nurse navigator should you have any questions/concerns: ?Rockwell Alexandria, Cardiac Imaging Nurse Navigator ?Larey Brick, Cardiac Imaging Nurse Navigator ?[facility] ?Direct Office Dial: [phone]  ? ?For scheduling needs, including cancellations and rescheduling, please call [place], [phone]. ? ?

## 2021-11-24 ENCOUNTER — Ambulatory Visit (HOSPITAL_COMMUNITY): Payer: BC Managed Care – PPO | Attending: Cardiovascular Disease

## 2021-11-24 DIAGNOSIS — R079 Chest pain, unspecified: Secondary | ICD-10-CM | POA: Insufficient documentation

## 2021-11-24 DIAGNOSIS — R072 Precordial pain: Secondary | ICD-10-CM | POA: Diagnosis not present

## 2021-11-24 LAB — ECHOCARDIOGRAM COMPLETE
Area-P 1/2: 3.48 cm2
S' Lateral: 2.2 cm

## 2021-11-29 ENCOUNTER — Telehealth (HOSPITAL_COMMUNITY): Payer: Self-pay | Admitting: Emergency Medicine

## 2021-11-29 NOTE — Telephone Encounter (Signed)
Attempted to call patient regarding upcoming cardiac CT appointment. °Left message on voicemail with name and callback number °[name] RN Navigator Cardiac Imaging °[facility] °[phone] Office °[phone] Cell ° °

## 2021-11-30 ENCOUNTER — Ambulatory Visit (HOSPITAL_COMMUNITY)
Admission: RE | Admit: 2021-11-30 | Discharge: 2021-11-30 | Disposition: A | Discharge status: Home / Self Care | Payer: BC Managed Care – PPO | Source: Ambulatory Visit | Attending: Internal Medicine | Admitting: Internal Medicine

## 2021-11-30 DIAGNOSIS — R072 Precordial pain: Secondary | ICD-10-CM | POA: Insufficient documentation

## 2021-11-30 MED ORDER — NITROGLYCERIN 0.4 MG SL SUBL
SUBLINGUAL_TABLET | SUBLINGUAL | Status: AC
  Filled 2021-11-30: qty 2

## 2021-11-30 MED ORDER — IOHEXOL 350 MG/ML SOLN: 100.0000 mL | Freq: Once | INTRAVENOUS | Status: DC | PRN

## 2021-11-30 MED ORDER — NITROGLYCERIN 0.4 MG SL SUBL
0.8000 mg | SUBLINGUAL_TABLET | Freq: Once | SUBLINGUAL | Status: AC
  Administered 2021-11-30: 0.8 mg via SUBLINGUAL

## 2021-11-30 MED ORDER — IOHEXOL 350 MG/ML SOLN
100.0000 mL | Freq: Once | INTRAVENOUS | Status: AC | PRN
  Administered 2021-11-30: 100 mL via INTRAVENOUS

## 2023-12-18 DIAGNOSIS — H6522 Chronic serous otitis media, left ear: Secondary | ICD-10-CM | POA: Insufficient documentation

## 2023-12-18 DIAGNOSIS — H9072 Mixed conductive and sensorineural hearing loss, unilateral, left ear, with unrestricted hearing on the contralateral side: Secondary | ICD-10-CM | POA: Insufficient documentation

## 2024-03-27 ENCOUNTER — Ambulatory Visit: Admitting: Podiatry

## 2024-03-27 ENCOUNTER — Other Ambulatory Visit: Payer: Self-pay | Admitting: Podiatry

## 2024-03-27 ENCOUNTER — Ambulatory Visit (INDEPENDENT_AMBULATORY_CARE_PROVIDER_SITE_OTHER)

## 2024-03-27 DIAGNOSIS — M7989 Other specified soft tissue disorders: Secondary | ICD-10-CM

## 2024-03-27 DIAGNOSIS — M7752 Other enthesopathy of left foot: Secondary | ICD-10-CM

## 2024-03-27 NOTE — Progress Notes (Signed)
 Subjective:  Patient ID: Kristen Vega Felt, female    DOB: 1970/10/09,  MRN: [id number]  Chief Complaint  Patient presents with   Foot Pain    Left ankle discomfort     53 y.o. female presents with the above complaint.  Patient presents with complaint of painful left ganglion cyst of the ankle.  Patient states is painful to touch has progressed gotten worse.  She is causing a lot of discomfort.  She has tried padding and protecting offloading none of which has helped.  She would like to discuss surgical options at this time as she has failed all conservative treatment options.  Continues to cause her some nerve sensation as well.   Review of Systems: Negative except as noted in the HPI. Denies N/V/F/Ch.  Past Medical History:  Diagnosis Date   Acquired von Willebrand disease    Carpal tunnel syndrome    Chest pain    Depression    GERD (gastroesophageal reflux disease)    Heavy alcohol use    History of cerebral aneurysm    Hyperlipidemia    ICH (intracerebral hemorrhage) (HCC)    Idiopathic neuropathy    OSA (obstructive sleep apnea)     Current Outpatient Medications:    Biotin 1 MG CAPS, Take by mouth., Disp: , Rfl:    cholecalciferol (VITAMIN D3) 25 MCG (1000 UNIT) tablet, Take 1,000 Units by mouth., Disp: , Rfl:    cyanocobalamin 100 MCG tablet, Take 100 mcg by mouth., Disp: , Rfl:    methocarbamol (ROBAXIN) 500 MG tablet, Take 500 mg by mouth., Disp: , Rfl:    naproxen (NAPROSYN) 500 MG tablet, Take 500 mg by mouth 2 (two) times daily as needed., Disp: , Rfl:    ofloxacin (FLOXIN) 0.3 % OTIC solution, SMARTSIG:10 Drop(s) Left Ear Twice Daily, Disp: , Rfl:    Omega-3 Fatty Acids (FISH OIL) 1000 MG CAPS, Take 1 g by mouth., Disp: , Rfl:    tiZANidine (ZANAFLEX) 4 MG tablet, Take 4 mg by mouth., Disp: , Rfl:    Vitamin E 670 MG (1000 UT) CAPS, Take 1,000 Units by mouth., Disp: , Rfl:    atorvastatin (LIPITOR) 40 MG tablet, Take 40 mg by mouth daily., Disp: , Rfl:     Cetirizine-Pseudoephedrine (ZYRTEC-D PO), Take 1 tablet by mouth at bedtime. , Disp: , Rfl:    Cinnamon 500 MG capsule, Take 500 mg by mouth., Disp: , Rfl:    esomeprazole (NEXIUM) 40 MG capsule, Take 40 mg daily at 12 noon by mouth., Disp: , Rfl:    estradiol (CLIMARA - DOSED IN MG/24 HR) 0.1 mg/24hr patch, Place 0.1 mg once a week onto the skin., Disp: , Rfl:    FLUoxetine (PROZAC) 20 MG capsule, Take 20 mg by mouth at bedtime. , Disp: , Rfl:    gabapentin (NEURONTIN) 600 MG tablet, Take 600 mg by mouth 2 (two) times daily. , Disp: , Rfl:    ibuprofen  (ADVIL ,MOTRIN ) 800 MG tablet, Take 1 tablet (800 mg total) by mouth every 8 (eight) hours as needed., Disp: 21 tablet, Rfl: 0   metoprolol  tartrate (LOPRESSOR ) 100 MG tablet, Take 1 tablet (100 mg total) by mouth once for 1 dose. Take 90-120 minutes prior to scan., Disp: 1 tablet, Rfl: 0   progesterone (PROMETRIUM) 100 MG capsule, Take 100 mg by mouth at bedtime. , Disp: , Rfl:    progesterone (PROMETRIUM) 100 MG capsule, Take 100 mg by mouth at bedtime., Disp: , Rfl:    traMADol (  ULTRAM) 50 MG tablet, Take 50 mg by mouth every 6 (six) hours as needed for moderate pain or severe pain (Back pain). , Disp: , Rfl:   Social History   Tobacco Use  Smoking Status Former   Types: Cigarettes  Smokeless Tobacco Not on file    No Known Allergies Objective:  There were no vitals filed for this visit. There is no height or weight on file to calculate BMI. Constitutional Well developed. Well nourished.  Vascular Dorsalis pedis pulses palpable bilaterally. Posterior tibial pulses palpable bilaterally. Capillary refill normal to all digits.  No cyanosis or clubbing noted. Pedal hair growth normal.  Neurologic Normal speech. Oriented to person, place, and time. Epicritic sensation to light touch grossly present bilaterally.  Dermatologic Nails well groomed and normal in appearance. No open wounds. No skin lesions.  Orthopedic: Pain on palpation  to the left ankle ganglion cyst this is a single lobulated large ganglion cyst that is not indurated.  Positive transilluminates.   Radiographs: 3 views of scale to mature adult left ankle: There is an increasing soft tissue density volume around the ankle joint.  No involvement of the bone noted.  No other abnormalities noted. Assessment:   1. Bursitis of left ankle   2. Soft tissue mass    Plan:  Patient was evaluated and treated and all questions answered.  Left ankle ganglion cyst - All questions and concerns were discussed with the patient in extensive detail given the amount of pain that she is experiencing she will benefit from surgical excision of the ganglion cyst I discussed my preoperative postoperative plan with the patient in extensive detail she states understand like to proceed with surgery. -Informed surgical risk consent was reviewed and read aloud to the patient.  I reviewed the films.  I have discussed my findings with the patient in great detail.  I have discussed all risks including but not limited to infection, stiffness, scarring, limp, disability, deformity, damage to blood vessels and nerves, numbness, poor healing, need for braces, arthritis, chronic pain, amputation, death.  All benefits and realistic expectations discussed in great detail.  I have made no promises as to the outcome.  I have provided realistic expectations.  I have offered the patient a 2nd opinion, which they have declined and assured me they preferred to proceed despite the risks   No follow-ups on file.

## 2024-05-10 ENCOUNTER — Telehealth: Payer: Self-pay | Admitting: Podiatry

## 2024-05-10 NOTE — Telephone Encounter (Signed)
 DOS- 06/10/2024  ANKLE EXC SOFT TISSUE MASS LT- 27632  [employer] EFFECTIVE DATE- 08/23/2023  DEDUCTIBLE- $7500 REMAINING- $6823.02 OOP- $9200 REMAINING- $2533.86 COINSURANCE- 50%  PER [employer] WEBSITE, NO PRIOR AUTH IS REQUIRED FOR CPT CODE 72367. DOCUMENTATION ATTACHED TO SURGICAL CONSENT PACKET. CPT CODE NOT FILED THROUGH [facility].

## 2024-06-07 ENCOUNTER — Telehealth: Payer: Self-pay | Admitting: Podiatry

## 2024-06-07 NOTE — Telephone Encounter (Signed)
 Patient called and r/s surgery due to illness. R/s for 12/1 per pt request. [facility] notified.

## 2024-06-19 ENCOUNTER — Encounter

## 2024-07-03 ENCOUNTER — Encounter

## 2024-07-22 ENCOUNTER — Telehealth: Payer: Self-pay | Admitting: Podiatry

## 2024-07-22 NOTE — Telephone Encounter (Signed)
 Patient called to cancel surgery at this time. She is unsure if/when she will reschedule. [facility] notified.

## 2024-07-31 ENCOUNTER — Encounter

## 2024-08-14 ENCOUNTER — Encounter

## 2024-08-28 ENCOUNTER — Encounter
